# Patient Record
Sex: Male | Born: 1990 | Race: Black or African American | Hispanic: No | Marital: Single | Smoking: Former smoker
Health system: Southern US, Community
[De-identification: ages and names within clinical notes are randomized; demographics above are authoritative.]

## PROBLEM LIST (undated history)

## (undated) DIAGNOSIS — B20 Human immunodeficiency virus [HIV] disease: Secondary | ICD-10-CM

## (undated) HISTORY — DX: Human immunodeficiency virus (HIV) disease: B20

---
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
## 2011-07-01 LAB — INFLUENZA A+B VIRUS AG-DIRECT(RAPID): Influenza B Ag: NEGATIVE

## 2011-07-05 LAB — POCT URINALYSIS DIP (DEVICE)
Bilirubin Urine: NEGATIVE
Glucose, UA: NEGATIVE
Operator id: 247071
Specific Gravity, Urine: 1.02
Urobilinogen, UA: 0.2

## 2011-07-05 LAB — HIV ANTIBODY (ROUTINE TESTING W REFLEX): HIV: NONREACTIVE

## 2011-07-05 LAB — HEPATITIS B SURFACE ANTIGEN: Hepatitis B Surface Ag: NEGATIVE

## 2011-07-05 LAB — RPR: RPR Ser Ql: NONREACTIVE

## 2013-07-29 ENCOUNTER — Telehealth: Payer: Self-pay

## 2013-07-29 NOTE — Telephone Encounter (Signed)
Spoke with patient who was given new intake appointment.  He was told what documents he would need to qualify for financial assistance.   Laurell Josephs, RN

## 2013-08-08 ENCOUNTER — Ambulatory Visit (INDEPENDENT_AMBULATORY_CARE_PROVIDER_SITE_OTHER): Payer: Self-pay

## 2013-08-08 DIAGNOSIS — Z113 Encounter for screening for infections with a predominantly sexual mode of transmission: Secondary | ICD-10-CM

## 2013-08-08 DIAGNOSIS — Z79899 Other long term (current) drug therapy: Secondary | ICD-10-CM

## 2013-08-08 DIAGNOSIS — B20 Human immunodeficiency virus [HIV] disease: Secondary | ICD-10-CM

## 2013-08-08 LAB — LIPID PANEL
HDL: 68 mg/dL (ref >39)
LDL Cholesterol: 68 mg/dL (ref 0–99)

## 2013-08-08 LAB — COMPLETE METABOLIC PANEL WITH GFR
ALT: 12 U/L (ref 0–53)
AST: 18 U/L (ref 0–37)
Alkaline Phosphatase: 69 U/L (ref 39–117)
Creat: 1.05 mg/dL (ref 0.50–1.35)
Total Bilirubin: 0.5 mg/dL (ref 0.3–1.2)

## 2013-08-08 LAB — CBC WITH DIFFERENTIAL/PLATELET
Basophils Absolute: 0 10*3/uL (ref 0.0–0.1)
Eosinophils Absolute: 0 10*3/uL (ref 0.0–0.7)
Eosinophils Relative: 1 % (ref 0–5)
MCH: 27.3 pg (ref 26.0–34.0)
MCV: 82.1 fL (ref 78.0–100.0)
Platelets: 182 10*3/uL (ref 150–400)
RDW: 14.4 % (ref 11.5–15.5)
WBC: 4.6 10*3/uL (ref 4.0–10.5)

## 2013-08-09 LAB — T-HELPER CELL (CD4) - (RCID CLINIC ONLY): CD4 % Helper T Cell: 25 % — ABNORMAL LOW (ref 33–55)

## 2013-08-09 LAB — URINALYSIS
Hgb urine dipstick: NEGATIVE
Ketones, ur: NEGATIVE mg/dL
Nitrite: NEGATIVE
Urobilinogen, UA: 1 mg/dL (ref 0.0–1.0)
pH: 7.5 (ref 5.0–8.0)

## 2013-08-09 LAB — HEPATITIS A ANTIBODY, TOTAL: Hep A Total Ab: NONREACTIVE

## 2013-08-09 LAB — HEPATITIS B SURFACE ANTIGEN: Hepatitis B Surface Ag: NEGATIVE

## 2013-08-09 LAB — HIV-1 RNA ULTRAQUANT REFLEX TO GENTYP+: HIV 1 RNA Quant: 38507 copies/mL — ABNORMAL HIGH (ref <20)

## 2013-08-09 LAB — HEPATITIS B SURFACE ANTIBODY,QUALITATIVE: Hep B S Ab: POSITIVE — AB

## 2013-08-09 LAB — HEPATITIS C ANTIBODY: HCV Ab: NEGATIVE

## 2013-08-14 DIAGNOSIS — B20 Human immunodeficiency virus [HIV] disease: Secondary | ICD-10-CM | POA: Insufficient documentation

## 2013-08-14 NOTE — Progress Notes (Signed)
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  He routinely test due to his sexual orientation.   Records received.  Vaccines up to date Patient is ready to start medications.   Laurell Josephs, RN

## 2013-08-22 ENCOUNTER — Ambulatory Visit (INDEPENDENT_AMBULATORY_CARE_PROVIDER_SITE_OTHER): Payer: Self-pay | Admitting: Internal Medicine

## 2013-08-22 ENCOUNTER — Encounter: Payer: Self-pay | Admitting: Internal Medicine

## 2013-08-22 VITALS — BP 120/71 | HR 72 | Temp 97.9°F | Ht 68.0 in | Wt 139.0 lb

## 2013-08-22 DIAGNOSIS — F329 Major depressive disorder, single episode, unspecified: Secondary | ICD-10-CM

## 2013-08-22 DIAGNOSIS — Z23 Encounter for immunization: Secondary | ICD-10-CM

## 2013-08-22 DIAGNOSIS — B20 Human immunodeficiency virus [HIV] disease: Secondary | ICD-10-CM

## 2013-08-22 MED ORDER — ELVITEG-COBIC-EMTRICIT-TENOFDF 150-150-200-300 MG PO TABS: 1.0000 | ORAL_TABLET | Freq: Every day | ORAL | Status: DC

## 2013-08-22 NOTE — Progress Notes (Signed)
  Subjective:    Patient ID: Alexander Huff, male    DOB: 19-Aug-1991, 22 y.o.   MRN: [id number]  HPI He comes in as a new patient evaluation. He was recently diagnosed with health Department with HIV. His risk factors MSM. He was previously tested at his college so he did not get the result and that was within a year. No other medical problems. No weight loss, no diarrhea. He is still coping with the diagnosis but does have good support including his mother who is in the room with [place]. His questions regarding HIV been answered. He is interested in starting therapy though he is a little hesitant since he has difficulty taking pills. He understands the need for medication. His lab results were discussed with him. CD4 count is 450 viral load is 30,000. He has a rash on his torso that is a chronic rash that comes and goes.    Review of Systems  Constitutional: Negative for fever, chills, activity change, fatigue and unexpected weight change.  HENT: Negative for trouble swallowing.   Eyes: Negative for visual disturbance.  Respiratory: Negative for cough and shortness of breath.   Cardiovascular: Negative for chest pain.  Gastrointestinal: Negative for nausea, abdominal pain and diarrhea.  Musculoskeletal: Negative for back pain.  Skin: Positive for rash.  Neurological: Negative for dizziness, light-headedness and headaches.  Hematological: Negative for adenopathy.  Psychiatric/Behavioral: Positive for dysphoric mood.       Objective:   Physical Exam  Constitutional: He is oriented to person, place, and time. He appears well-developed and well-nourished. No distress.  HENT:  Mouth/Throat: No oropharyngeal exudate.  Eyes: Right eye exhibits no discharge. Left eye exhibits no discharge. No scleral icterus.  Cardiovascular: Normal rate, regular rhythm and normal heart sounds.   No murmur heard. Pulmonary/Chest: Effort normal and breath sounds normal. No respiratory distress. He has no wheezes.   Lymphadenopathy:    He has no cervical adenopathy.  Neurological: He is alert and oriented to person, place, and time.  Skin: Skin is warm and dry. No rash noted.  Psychiatric: He has a normal mood and affect. His behavior is normal.          Assessment & Plan:

## 2013-08-22 NOTE — Assessment & Plan Note (Signed)
He will be given information to followup with our counselor.

## 2013-08-22 NOTE — Assessment & Plan Note (Signed)
I had a long discussion with the patient and his mother who is in the room. He is am hesitant to start therapy because the use not good with pills but does understand the need to take it. He will come back in about 2-3 weeks and we will readdress this and he will continue to finish his drug assistance paperwork. I will start him on Stribild and this was discussed with him, and he is ready. All questions were answered. He will get hepatitis A and flu shot today. Condom use was discussed. Term effects of medication and HIV discussed

## 2013-09-12 ENCOUNTER — Telehealth: Payer: Self-pay | Admitting: *Deleted

## 2013-09-12 ENCOUNTER — Ambulatory Visit: Payer: Self-pay | Admitting: Internal Medicine

## 2013-09-12 NOTE — Telephone Encounter (Signed)
Called patient and rescheduled his appt for 09/19/13 at 4:15 pm, he no showed today. Alexander Huff

## 2013-09-19 ENCOUNTER — Encounter: Payer: Self-pay | Admitting: Internal Medicine

## 2013-09-19 ENCOUNTER — Ambulatory Visit (INDEPENDENT_AMBULATORY_CARE_PROVIDER_SITE_OTHER): Payer: Self-pay | Admitting: Internal Medicine

## 2013-09-19 VITALS — BP 128/81 | HR 69 | Temp 98.2°F | Ht 67.0 in | Wt 136.0 lb

## 2013-09-19 DIAGNOSIS — B20 Human immunodeficiency virus [HIV] disease: Secondary | ICD-10-CM

## 2013-09-19 DIAGNOSIS — F329 Major depressive disorder, single episode, unspecified: Secondary | ICD-10-CM

## 2013-09-19 NOTE — Progress Notes (Signed)
  Subjective:    Patient ID: Alexander Huff, male    DOB: 1991-03-23, 22 y.o.   MRN: [id number]  HPI  He comes in second visit. He was recently diagnosed with health Department with HIV. His risk factors MSM. He was previously tested at his college so he did not get the result and that was within a year. No other medical problems. No weight loss, no diarrhea. He feels much better with accepting his diagnosis and does not feel he has any depression. CD4 count is 450 viral load is 30,000. He unfortunately has not yet received his medication. He is still waiting for approval through the drug assistance program. He is interested in trying to maintain his weight or gained weight.   Review of Systems  Constitutional: Negative for fever, chills, activity change, fatigue and unexpected weight change.  HENT: Negative for trouble swallowing.   Eyes: Negative for visual disturbance.  Respiratory: Negative for cough and shortness of breath.   Cardiovascular: Negative for chest pain.  Gastrointestinal: Negative for nausea, abdominal pain and diarrhea.  Musculoskeletal: Negative for back pain.  Skin: Negative for rash.  Neurological: Negative for dizziness, light-headedness and headaches.  Hematological: Negative for adenopathy.  Psychiatric/Behavioral: Negative for dysphoric mood.       Objective:   Physical Exam  Constitutional: He is oriented to person, place, and time. He appears well-developed and well-nourished. No distress.  HENT:  Mouth/Throat: No oropharyngeal exudate.  Eyes: Right eye exhibits no discharge. Left eye exhibits no discharge. No scleral icterus.  Cardiovascular: Normal rate, regular rhythm and normal heart sounds.   No murmur heard. Pulmonary/Chest: Effort normal and breath sounds normal. No respiratory distress. He has no wheezes.  Lymphadenopathy:    He has no cervical adenopathy.  Neurological: He is alert and oriented to person, place, and time.  Skin: Skin is warm and  dry. No rash noted.  Psychiatric: He has a normal mood and affect. His behavior is normal.          Assessment & Plan:

## 2013-09-19 NOTE — Assessment & Plan Note (Signed)
No current concerns voiced

## 2013-09-19 NOTE — Assessment & Plan Note (Signed)
I anticipate he will get his medications in the next week or 2 and we'll schedule him for labs in 4 weeks and I will see him in 5 weeks.

## 2013-10-07 ENCOUNTER — Other Ambulatory Visit: Payer: Self-pay | Admitting: *Deleted

## 2013-10-07 DIAGNOSIS — B20 Human immunodeficiency virus [HIV] disease: Secondary | ICD-10-CM

## 2013-10-07 MED ORDER — ELVITEG-COBIC-EMTRICIT-TENOFDF 150-150-200-300 MG PO TABS: 1.0000 | ORAL_TABLET | Freq: Every day | ORAL | Status: DC

## 2013-10-17 ENCOUNTER — Other Ambulatory Visit: Payer: Self-pay

## 2013-10-24 ENCOUNTER — Encounter: Payer: Self-pay | Admitting: Internal Medicine

## 2013-10-24 ENCOUNTER — Ambulatory Visit (INDEPENDENT_AMBULATORY_CARE_PROVIDER_SITE_OTHER): Payer: Self-pay | Admitting: Internal Medicine

## 2013-10-24 VITALS — BP 116/75 | HR 77 | Temp 98.3°F | Ht 68.0 in | Wt 140.0 lb

## 2013-10-24 DIAGNOSIS — F32A Depression, unspecified: Secondary | ICD-10-CM

## 2013-10-24 DIAGNOSIS — B20 Human immunodeficiency virus [HIV] disease: Secondary | ICD-10-CM

## 2013-10-24 DIAGNOSIS — F3289 Other specified depressive episodes: Secondary | ICD-10-CM

## 2013-10-24 DIAGNOSIS — F329 Major depressive disorder, single episode, unspecified: Secondary | ICD-10-CM

## 2013-10-24 DIAGNOSIS — L42 Pityriasis rosea: Secondary | ICD-10-CM

## 2013-10-24 MED ORDER — HYDROCORTISONE 1 % EX LOTN: 1.0000 "application " | TOPICAL_LOTION | Freq: Two times a day (BID) | CUTANEOUS | Status: AC

## 2013-10-25 ENCOUNTER — Telehealth: Payer: Self-pay | Admitting: *Deleted

## 2013-10-25 DIAGNOSIS — L42 Pityriasis rosea: Secondary | ICD-10-CM | POA: Insufficient documentation

## 2013-10-25 NOTE — Assessment & Plan Note (Addendum)
Stable, no current complaints, adjusting to diagnosis

## 2013-10-25 NOTE — Assessment & Plan Note (Signed)
Doing well on regimen.  RTC in 2 weeks for labs and 3 weeks with me

## 2013-10-25 NOTE — Telephone Encounter (Signed)
Message copied by Andree CossHOWELL, Edilson Vital M on Fri Oct 25, 2013  9:10 AM ------      Message from: Gardiner BarefootOMER, ROBERT W      Created: Thu Oct 24, 2013  5:25 PM       He was interested in partner testing (for a friend not a partner who needs it).  Please remind him of when and where to bring his friend in for testing.  Thanks ------

## 2013-10-25 NOTE — Telephone Encounter (Signed)
Left a message on patient's phone asking him to call at his earliest convenience. Andree CossHowell, Alwyn Cordner M, RN

## 2013-10-25 NOTE — Progress Notes (Signed)
   Subjective:    Patient ID: Alexander Huff, male    DOB: May 12, 1991, 23 y.o.   MRN: [id number]  HPI Here for follow up of HIV.  Started Stribild 2 weeks ago. No new issues, no n/v, no new rash. Does have stable rash on torso that is mildly pruritic.  No weight loss, no diarrhea.  Does not smoke.     Review of Systems  Constitutional: Negative for fatigue.  Respiratory: Negative for shortness of breath.   Gastrointestinal: Negative for nausea, abdominal pain and diarrhea.  Neurological: Negative for dizziness and light-headedness.       Objective:   Physical Exam  Constitutional: He appears well-developed and well-nourished. No distress.  HENT:  Mouth/Throat: No oropharyngeal exudate.  Eyes: No scleral icterus.  Cardiovascular: Normal rate, regular rhythm and normal heart sounds.   No murmur heard. Pulmonary/Chest: Effort normal and breath sounds normal. No respiratory distress. He has no wheezes.  Lymphadenopathy:    He has no cervical adenopathy.  Skin: Rash noted.  Rash on torso with hypopigmented patches, somewhat a "christmas tree" pattern          Assessment & Plan:

## 2013-10-25 NOTE — Assessment & Plan Note (Signed)
Will try topical steroids.  Told patient regardless it will likely resolve in 2-3 months

## 2013-11-08 ENCOUNTER — Other Ambulatory Visit: Payer: Self-pay

## 2013-11-13 ENCOUNTER — Encounter: Payer: Self-pay | Admitting: Infectious Diseases

## 2013-11-13 ENCOUNTER — Ambulatory Visit (INDEPENDENT_AMBULATORY_CARE_PROVIDER_SITE_OTHER): Payer: Self-pay | Admitting: Infectious Diseases

## 2013-11-13 VITALS — BP 101/72 | HR 72 | Temp 98.1°F | Ht 68.0 in | Wt 141.0 lb

## 2013-11-13 DIAGNOSIS — Z113 Encounter for screening for infections with a predominantly sexual mode of transmission: Secondary | ICD-10-CM

## 2013-11-13 DIAGNOSIS — Z79899 Other long term (current) drug therapy: Secondary | ICD-10-CM

## 2013-11-13 DIAGNOSIS — B20 Human immunodeficiency virus [HIV] disease: Secondary | ICD-10-CM

## 2013-11-13 NOTE — Progress Notes (Signed)
   Subjective:    Patient ID: Alexander Huff, male    DOB: 1990/12/16, 23 y.o.   MRN: [id number]  HPI 23 yo M newly dx with HIV in 2014. Was started on stribild Dec 2014. Has had no problems taking this. Has questions about having kids.   HIV 1 RNA Quant (copies/mL)  Date Value  08/08/2013 38507*     CD4 T Cell Abs (/uL)  Date Value  08/08/2013 480      Review of Systems  Constitutional: Negative for fever, chills, appetite change and unexpected weight change.  Respiratory: Negative for shortness of breath.   Gastrointestinal: Negative for diarrhea and constipation.  Genitourinary: Negative for difficulty urinating.  Neurological: Negative for headaches.       Objective:   Physical Exam  Constitutional: He appears well-developed and well-nourished.  HENT:  Mouth/Throat: No oropharyngeal exudate.  Eyes: EOM are normal. Pupils are equal, round, and reactive to light.  Neck: Neck supple.  Cardiovascular: Normal rate, regular rhythm and normal heart sounds.   Pulmonary/Chest: Effort normal and breath sounds normal.  Abdominal: Soft. Bowel sounds are normal. He exhibits no distension. There is no tenderness.  Lymphadenopathy:    He has no cervical adenopathy.          Assessment & Plan:

## 2013-11-13 NOTE — Assessment & Plan Note (Signed)
He appears to be doing very well. I offered to repeat his blood work today but he wishes to wait until he has f/u appt. I counseled him about using condoms, rhythm method for pregnancy. I advised him that the safest method for him and his partner is for him to take his ART and be undetectable. Will see him back in 3-4 months.  It is not clear if he needs his PNVX? Otherwise his vax are uptodate, his Hep B status is immune.

## 2013-11-27 ENCOUNTER — Encounter: Payer: Self-pay | Admitting: *Deleted

## 2013-11-27 NOTE — Telephone Encounter (Signed)
Error

## 2014-01-08 ENCOUNTER — Telehealth: Payer: Self-pay | Admitting: *Deleted

## 2014-01-08 NOTE — Telephone Encounter (Signed)
Called [employer] and got Sevrin an emergency supply of Stribild.

## 2014-01-14 ENCOUNTER — Other Ambulatory Visit: Payer: Self-pay | Admitting: *Deleted

## 2014-01-14 ENCOUNTER — Encounter: Payer: Self-pay | Admitting: *Deleted

## 2014-01-14 DIAGNOSIS — B20 Human immunodeficiency virus [HIV] disease: Secondary | ICD-10-CM

## 2014-01-14 MED ORDER — ELVITEG-COBIC-EMTRICIT-TENOFDF 150-150-200-300 MG PO TABS: 1.0000 | ORAL_TABLET | Freq: Every day | ORAL | Status: DC

## 2014-01-14 NOTE — Progress Notes (Signed)
ADAP renewal 

## 2014-01-29 ENCOUNTER — Other Ambulatory Visit: Payer: Self-pay

## 2014-02-20 ENCOUNTER — Telehealth: Payer: Self-pay | Admitting: *Deleted

## 2014-02-20 ENCOUNTER — Encounter: Payer: Self-pay | Admitting: Internal Medicine

## 2014-02-20 ENCOUNTER — Ambulatory Visit (INDEPENDENT_AMBULATORY_CARE_PROVIDER_SITE_OTHER): Payer: Self-pay | Admitting: Internal Medicine

## 2014-02-20 VITALS — BP 110/72 | HR 62 | Temp 98.3°F | Ht 68.0 in | Wt 138.0 lb

## 2014-02-20 DIAGNOSIS — Z23 Encounter for immunization: Secondary | ICD-10-CM

## 2014-02-20 DIAGNOSIS — B20 Human immunodeficiency virus [HIV] disease: Secondary | ICD-10-CM

## 2014-02-20 DIAGNOSIS — K648 Other hemorrhoids: Secondary | ICD-10-CM

## 2014-02-20 DIAGNOSIS — K649 Unspecified hemorrhoids: Secondary | ICD-10-CM

## 2014-02-20 LAB — CBC WITH DIFFERENTIAL/PLATELET
BASOS PCT: 0 % (ref 0–1)
Basophils Absolute: 0 10*3/uL (ref 0.0–0.1)
EOS PCT: 1 % (ref 0–5)
Eosinophils Absolute: 0 10*3/uL (ref 0.0–0.7)
HCT: 45.7 % (ref 39.0–52.0)
HEMOGLOBIN: 15.5 g/dL (ref 13.0–17.0)
LYMPHS ABS: 2.1 10*3/uL (ref 0.7–4.0)
Lymphocytes Relative: 54 % — ABNORMAL HIGH (ref 12–46)
MCH: 28.5 pg (ref 26.0–34.0)
MCHC: 33.9 g/dL (ref 30.0–36.0)
MCV: 84.2 fL (ref 78.0–100.0)
MONOS PCT: 12 % (ref 3–12)
Monocytes Absolute: 0.5 10*3/uL (ref 0.1–1.0)
Neutro Abs: 1.3 10*3/uL — ABNORMAL LOW (ref 1.7–7.7)
Neutrophils Relative %: 33 % — ABNORMAL LOW (ref 43–77)
PLATELETS: 198 10*3/uL (ref 150–400)
RBC: 5.43 MIL/uL (ref 4.22–5.81)
RDW: 14.6 % (ref 11.5–15.5)
WBC: 3.9 10*3/uL — ABNORMAL LOW (ref 4.0–10.5)

## 2014-02-20 LAB — COMPLETE METABOLIC PANEL WITH GFR
ALT: 11 U/L (ref 0–53)
AST: 13 U/L (ref 0–37)
Albumin: 4.3 g/dL (ref 3.5–5.2)
Alkaline Phosphatase: 73 U/L (ref 39–117)
BUN: 11 mg/dL (ref 6–23)
CALCIUM: 9.6 mg/dL (ref 8.4–10.5)
CHLORIDE: 103 meq/L (ref 96–112)
CO2: 29 meq/L (ref 19–32)
CREATININE: 1.32 mg/dL (ref 0.50–1.35)
GFR, Est African American: 89 mL/min
GFR, Est Non African American: 77 mL/min
Glucose, Bld: 79 mg/dL (ref 70–99)
Potassium: 4.5 mEq/L (ref 3.5–5.3)
SODIUM: 139 meq/L (ref 135–145)
Total Bilirubin: 0.5 mg/dL (ref 0.2–1.2)
Total Protein: 7.1 g/dL (ref 6.0–8.3)

## 2014-02-20 MED ORDER — HYDROCORTISONE ACETATE 25 MG RE SUPP: 25.0000 mg | Freq: Two times a day (BID) | RECTAL | Status: AC

## 2014-02-20 NOTE — Addendum Note (Signed)
Addended by: Andree CossHOWELL, Bethani Brugger M on: 02/20/2014 02:45 PM   Modules accepted: Orders

## 2014-02-20 NOTE — Assessment & Plan Note (Signed)
Doing well on stribild.  Labs today.  RTC 3 months unless issues with todays labs.

## 2014-02-20 NOTE — Progress Notes (Signed)
   Subjective:    Patient ID: Alexander Huff, male    DOB: Jan 07, 1991, 23 y.o.   MRN: [id number]  HPI He comes in for follow up of HIV.  He was last seen about 3 month ago on Stribild which he started December 2014.  He reports excellent compliance with only 2 missed doses since then.  No nausea or issues with medicine.  No wieght loss, no diarrhea.  Has a video of what appears to be some prolapse of his anus.  It is all outer skin though, no mucous membranes exposed.  I am unsure of what this is or if it is correctable.  Some blood on toilet tissue.     Review of Systems  Constitutional: Negative for fever and fatigue.  HENT: Negative for trouble swallowing.   Gastrointestinal: Negative for nausea and diarrhea.  Skin: Negative for rash.  Neurological: Negative for dizziness and light-headedness.  Hematological: Negative for adenopathy.       Objective:   Physical Exam  Constitutional: He appears well-developed and well-nourished. No distress.  HENT:  Mouth/Throat: No oropharyngeal exudate.  Eyes: Right eye exhibits no discharge. Left eye exhibits no discharge. No scleral icterus.  Cardiovascular: Normal rate, regular rhythm and normal heart sounds.   No murmur heard. Pulmonary/Chest: Effort normal and breath sounds normal. No respiratory distress. He has no wheezes.  Lymphadenopathy:    He has no cervical adenopathy.  Skin: No rash noted.          Assessment & Plan:

## 2014-02-20 NOTE — Telephone Encounter (Signed)
Pt [facility] for [facility].  Appt is 6/1 at 9:00.  Pt is to bring $65 to the appointment.  Phone number to cancel/change appt is [phone], option #1.  Unable to notify patient - his voicemail has not been set up on his preferred number, cell is not taking incoming calls at this time.  Will call patient next week. Pt was given information at office visit to speak with [facility] about the [employer] and Agricultural engineer[employer]re's contact info for insurance. Andree CossMichelle M Kaleigh Spiegelman, RN

## 2014-02-20 NOTE — Assessment & Plan Note (Addendum)
I am not sure if this is a prolapse of some kind?  I will refer to general surgery.    Will try anusol suppository

## 2014-02-21 LAB — T-HELPER CELL (CD4) - (RCID CLINIC ONLY)
CD4 % Helper T Cell: 27 % — ABNORMAL LOW (ref 33–55)
CD4 T Cell Abs: 650 /uL (ref 400–2700)

## 2014-02-21 LAB — HIV-1 RNA QUANT-NO REFLEX-BLD: HIV 1 RNA Quant: <20 copies/mL (ref <20)

## 2014-03-13 ENCOUNTER — Encounter: Payer: Self-pay | Admitting: *Deleted

## 2014-04-09 ENCOUNTER — Other Ambulatory Visit: Payer: Self-pay | Admitting: Internal Medicine

## 2014-05-09 ENCOUNTER — Other Ambulatory Visit: Payer: Self-pay | Admitting: Internal Medicine

## 2014-05-09 DIAGNOSIS — B20 Human immunodeficiency virus [HIV] disease: Secondary | ICD-10-CM

## 2014-05-15 ENCOUNTER — Other Ambulatory Visit: Payer: Self-pay

## 2014-05-29 ENCOUNTER — Ambulatory Visit: Payer: Self-pay | Admitting: Internal Medicine

## 2014-05-29 ENCOUNTER — Other Ambulatory Visit (INDEPENDENT_AMBULATORY_CARE_PROVIDER_SITE_OTHER): Payer: Self-pay

## 2014-05-29 DIAGNOSIS — Z113 Encounter for screening for infections with a predominantly sexual mode of transmission: Secondary | ICD-10-CM

## 2014-05-29 DIAGNOSIS — Z79899 Other long term (current) drug therapy: Secondary | ICD-10-CM

## 2014-05-29 DIAGNOSIS — B20 Human immunodeficiency virus [HIV] disease: Secondary | ICD-10-CM

## 2014-05-29 LAB — LIPID PANEL
Cholesterol: 181 mg/dL (ref 0–200)
HDL: 51 mg/dL (ref >39)
LDL Cholesterol: 115 mg/dL — ABNORMAL HIGH (ref 0–99)
Total CHOL/HDL Ratio: 3.5 Ratio
Triglycerides: 77 mg/dL (ref <150)
VLDL: 15 mg/dL (ref 0–40)

## 2014-05-29 LAB — CBC WITH DIFFERENTIAL/PLATELET
BASOS ABS: 0 10*3/uL (ref 0.0–0.1)
Basophils Relative: 0 % (ref 0–1)
EOS PCT: 1 % (ref 0–5)
Eosinophils Absolute: 0 10*3/uL (ref 0.0–0.7)
HCT: 48.9 % (ref 39.0–52.0)
Hemoglobin: 16.3 g/dL (ref 13.0–17.0)
LYMPHS ABS: 2 10*3/uL (ref 0.7–4.0)
LYMPHS PCT: 60 % — AB (ref 12–46)
MCH: 28.4 pg (ref 26.0–34.0)
MCHC: 33.3 g/dL (ref 30.0–36.0)
MCV: 85.3 fL (ref 78.0–100.0)
Monocytes Absolute: 0.4 10*3/uL (ref 0.1–1.0)
Monocytes Relative: 11 % (ref 3–12)
NEUTROS PCT: 28 % — AB (ref 43–77)
Neutro Abs: 0.9 10*3/uL — ABNORMAL LOW (ref 1.7–7.7)
PLATELETS: 160 10*3/uL (ref 150–400)
RBC: 5.73 MIL/uL (ref 4.22–5.81)
RDW: 13.6 % (ref 11.5–15.5)
WBC: 3.3 10*3/uL — AB (ref 4.0–10.5)

## 2014-05-29 LAB — COMPLETE METABOLIC PANEL WITH GFR
ALT: 11 U/L (ref 0–53)
AST: 15 U/L (ref 0–37)
Albumin: 4.5 g/dL (ref 3.5–5.2)
Alkaline Phosphatase: 63 U/L (ref 39–117)
BILIRUBIN TOTAL: 0.4 mg/dL (ref 0.2–1.2)
BUN: 12 mg/dL (ref 6–23)
CHLORIDE: 102 meq/L (ref 96–112)
CO2: 27 mEq/L (ref 19–32)
CREATININE: 1.38 mg/dL — AB (ref 0.50–1.35)
Calcium: 9.9 mg/dL (ref 8.4–10.5)
GFR, EST AFRICAN AMERICAN: 84 mL/min
GFR, Est Non African American: 73 mL/min
Glucose, Bld: 80 mg/dL (ref 70–99)
Potassium: 5.1 mEq/L (ref 3.5–5.3)
SODIUM: 139 meq/L (ref 135–145)
TOTAL PROTEIN: 7.6 g/dL (ref 6.0–8.3)

## 2014-05-29 LAB — RPR

## 2014-05-30 LAB — HIV-1 RNA QUANT-NO REFLEX-BLD: HIV-1 RNA Quant, Log: <1.3 {Log} (ref <1.30)

## 2014-05-30 LAB — T-HELPER CELL (CD4) - (RCID CLINIC ONLY)
CD4 % Helper T Cell: 30 % — ABNORMAL LOW (ref 33–55)
CD4 T Cell Abs: 630 /uL (ref 400–2700)

## 2014-06-17 ENCOUNTER — Ambulatory Visit: Payer: Self-pay

## 2014-06-17 ENCOUNTER — Ambulatory Visit: Payer: Self-pay | Admitting: Internal Medicine

## 2014-07-07 ENCOUNTER — Encounter (HOSPITAL_COMMUNITY): Payer: Self-pay | Admitting: Emergency Medicine

## 2014-07-07 ENCOUNTER — Emergency Department (INDEPENDENT_AMBULATORY_CARE_PROVIDER_SITE_OTHER)
Admission: EM | Admit: 2014-07-07 | Discharge: 2014-07-07 | Disposition: A | Discharge status: Home / Self Care | Payer: Self-pay | Source: Home / Self Care | Attending: Family Medicine | Admitting: Family Medicine

## 2014-07-07 DIAGNOSIS — K600 Acute anal fissure: Secondary | ICD-10-CM

## 2014-07-07 DIAGNOSIS — K602 Anal fissure, unspecified: Secondary | ICD-10-CM

## 2014-07-07 MED ORDER — DILTIAZEM GEL 2 %
1.0000 | Freq: Three times a day (TID) | CUTANEOUS | Status: AC
Start: 2014-07-07 — End: ?

## 2014-07-07 NOTE — Discharge Instructions (Signed)
Use medicine as prescribed, see surgeon if further problems °

## 2014-07-07 NOTE — ED Provider Notes (Signed)
CSN: [id number]     Arrival date & time 07/07/14  1337 History   First MD Initiated Contact with Patient 07/07/14 1433     Chief Complaint  Patient presents with  . Rectal Pain   (Consider location/radiation/quality/duration/timing/severity/associated sxs/prior Treatment) Patient is a 23 y.o. male presenting with hematochezia. The history is provided by the patient.  Rectal Bleeding Quality:  Bright red Amount:  Scant Duration:  2 months Progression:  Unchanged Chronicity:  Chronic Context: anal fissures, anal penetration, constipation and rectal pain   Context: not hemorrhoids   Pain details:    Severity:  Mild Similar prior episodes: yes   Relieved by:  Nothing Ineffective treatments:  Hemorrhoid cream Associated symptoms: no abdominal pain and no vomiting     Past Medical History  Diagnosis Date  . HIV infection    History reviewed. No pertinent past surgical history. Family History  Problem Relation Age of Onset  . Healthy Mother   . Heart disease Father   . Blindness Father    History  Substance Use Topics  . Smoking status: Former Smoker -- 0.10 packs/day for 1 years    Types: Pipe    Start date: 08/10/2012  . Smokeless tobacco: Never Used  . Alcohol Use: 1.0 oz/week    2 drink(s) per week     Comment: occasional    Review of Systems  Constitutional: Negative.   Gastrointestinal: Positive for constipation, blood in stool, hematochezia and rectal pain. Negative for nausea, vomiting and abdominal pain.    Allergies  Review of patient's allergies indicates no known allergies.  Home Medications   Prior to Admission medications   Medication Sig Start Date End Date Taking? Authorizing Provider  diltiazem 2 % GEL Apply 1 application topically 3 (three) times daily. 07/07/14   Linna Hoff, MD  hydrocortisone (ANUSOL-HC) 25 MG suppository Place 1 suppository (25 mg total) rectally 2 (two) times daily. 02/20/14   Gardiner Barefoot, MD  hydrocortisone 1 % lotion  Apply 1 application topically 2 (two) times daily. 10/24/13   Gardiner Barefoot, MD  STRIBILD 150-150-200-300 MG TABS tablet TAKE 1 TABLET BY MOUTH EVERY DAY WITH BREAKFAST 05/09/14   Gardiner Barefoot, MD   There were no vitals taken for this visit. Physical Exam  Nursing note and vitals reviewed. Constitutional: He is oriented to person, place, and time.  Abdominal: Soft. Bowel sounds are normal.  Genitourinary:  Anal fissure tenderness, no bleeding, no hemorrhoids.  Neurological: He is alert and oriented to person, place, and time.  Skin: Skin is warm and dry.    ED Course  Procedures (including critical care time) Labs Review Labs Reviewed - No data to display  Imaging Review No results found.   MDM   1. Acute anal fissure        Linna Hoff, MD 07/07/14 [id number]

## 2014-07-07 NOTE — ED Notes (Signed)
C/o 2 month duration of rectal pain, bleeding w stool

## 2014-08-11 ENCOUNTER — Other Ambulatory Visit: Payer: Self-pay | Admitting: *Deleted

## 2014-08-11 DIAGNOSIS — B20 Human immunodeficiency virus [HIV] disease: Secondary | ICD-10-CM

## 2014-08-11 MED ORDER — ELVITEG-COBIC-EMTRICIT-TENOFDF 150-150-200-300 MG PO TABS: ORAL_TABLET | ORAL | Status: DC

## 2014-09-09 ENCOUNTER — Other Ambulatory Visit: Payer: Self-pay | Admitting: Licensed Clinical Social Worker

## 2014-09-09 DIAGNOSIS — B20 Human immunodeficiency virus [HIV] disease: Secondary | ICD-10-CM

## 2014-09-09 MED ORDER — ELVITEG-COBIC-EMTRICIT-TENOFDF 150-150-200-300 MG PO TABS: ORAL_TABLET | ORAL | Status: DC

## 2015-01-19 ENCOUNTER — Other Ambulatory Visit: Payer: Self-pay | Admitting: *Deleted

## 2015-01-19 DIAGNOSIS — B20 Human immunodeficiency virus [HIV] disease: Secondary | ICD-10-CM

## 2015-01-19 MED ORDER — ELVITEG-COBIC-EMTRICIT-TENOFDF 150-150-200-300 MG PO TABS: ORAL_TABLET | ORAL | Status: DC

## 2015-01-20 ENCOUNTER — Other Ambulatory Visit: Payer: Self-pay | Admitting: *Deleted

## 2015-01-20 DIAGNOSIS — B20 Human immunodeficiency virus [HIV] disease: Secondary | ICD-10-CM

## 2015-01-20 MED ORDER — ELVITEG-COBIC-EMTRICIT-TENOFDF 150-150-200-300 MG PO TABS: ORAL_TABLET | ORAL | Status: DC

## 2015-01-20 NOTE — Telephone Encounter (Signed)
ADAP application 

## 2015-04-15 ENCOUNTER — Other Ambulatory Visit: Payer: Self-pay | Admitting: Pharmacist Clinician (PhC)/ Clinical Pharmacy Specialist

## 2015-04-15 DIAGNOSIS — B20 Human immunodeficiency virus [HIV] disease: Secondary | ICD-10-CM

## 2015-04-15 MED ORDER — ELVITEG-COBIC-EMTRICIT-TENOFDF 150-150-200-300 MG PO TABS: 1.0000 | ORAL_TABLET | Freq: Every day | ORAL | Status: DC

## 2015-04-15 MED ORDER — ELVITEG-COBIC-EMTRICIT-TENOFDF 150-150-200-300 MG PO TABS: ORAL_TABLET | ORAL | Status: DC

## 2015-07-23 ENCOUNTER — Other Ambulatory Visit: Payer: Self-pay | Admitting: Internal Medicine

## 2015-07-23 MED ORDER — ELVITEG-COBIC-EMTRICIT-TENOFDF 150-150-200-300 MG PO TABS: 1.0000 | ORAL_TABLET | Freq: Every day | ORAL | Status: DC

## 2015-07-29 ENCOUNTER — Other Ambulatory Visit (INDEPENDENT_AMBULATORY_CARE_PROVIDER_SITE_OTHER): Payer: Self-pay

## 2015-07-29 DIAGNOSIS — Z79899 Other long term (current) drug therapy: Secondary | ICD-10-CM

## 2015-07-29 DIAGNOSIS — Z113 Encounter for screening for infections with a predominantly sexual mode of transmission: Secondary | ICD-10-CM

## 2015-07-29 DIAGNOSIS — B20 Human immunodeficiency virus [HIV] disease: Secondary | ICD-10-CM

## 2015-07-29 LAB — COMPLETE METABOLIC PANEL WITH GFR
ALT: 12 U/L (ref 9–46)
AST: 21 U/L (ref 10–40)
Albumin: 4.5 g/dL (ref 3.6–5.1)
Alkaline Phosphatase: 62 U/L (ref 40–115)
BUN: 12 mg/dL (ref 7–25)
CHLORIDE: 103 mmol/L (ref 98–110)
CO2: 29 mmol/L (ref 20–31)
Calcium: 9.5 mg/dL (ref 8.6–10.3)
Creat: 1.24 mg/dL (ref 0.60–1.35)
GFR, EST NON AFRICAN AMERICAN: 82 mL/min (ref >=60)
GLUCOSE: 81 mg/dL (ref 65–99)
POTASSIUM: 4.8 mmol/L (ref 3.5–5.3)
SODIUM: 141 mmol/L (ref 135–146)
Total Bilirubin: 0.7 mg/dL (ref 0.2–1.2)
Total Protein: 7.3 g/dL (ref 6.1–8.1)

## 2015-07-29 LAB — CBC WITH DIFFERENTIAL/PLATELET
BASOS ABS: 0 10*3/uL (ref 0.0–0.1)
BASOS PCT: 0 % (ref 0–1)
EOS ABS: 0 10*3/uL (ref 0.0–0.7)
EOS PCT: 1 % (ref 0–5)
HCT: 47.6 % (ref 39.0–52.0)
Hemoglobin: 15.7 g/dL (ref 13.0–17.0)
Lymphocytes Relative: 61 % — ABNORMAL HIGH (ref 12–46)
Lymphs Abs: 2.1 10*3/uL (ref 0.7–4.0)
MCH: 28.5 pg (ref 26.0–34.0)
MCHC: 33 g/dL (ref 30.0–36.0)
MCV: 86.5 fL (ref 78.0–100.0)
MONOS PCT: 10 % (ref 3–12)
MPV: 10.8 fL (ref 8.6–12.4)
Monocytes Absolute: 0.4 10*3/uL (ref 0.1–1.0)
NEUTROS PCT: 28 % — AB (ref 43–77)
Neutro Abs: 1 10*3/uL — ABNORMAL LOW (ref 1.7–7.7)
PLATELETS: 151 10*3/uL (ref 150–400)
RBC: 5.5 MIL/uL (ref 4.22–5.81)
RDW: 13.6 % (ref 11.5–15.5)
WBC: 3.5 10*3/uL — ABNORMAL LOW (ref 4.0–10.5)

## 2015-07-29 LAB — LIPID PANEL
CHOL/HDL RATIO: 3.7 ratio (ref <=5.0)
CHOLESTEROL: 177 mg/dL (ref 125–200)
HDL: 48 mg/dL (ref >=40)
LDL CALC: 116 mg/dL (ref <130)
TRIGLYCERIDES: 66 mg/dL (ref <150)
VLDL: 13 mg/dL (ref <30)

## 2015-07-30 LAB — HIV-1 RNA QUANT-NO REFLEX-BLD

## 2015-07-30 LAB — URINE CYTOLOGY ANCILLARY ONLY
CHLAMYDIA, DNA PROBE: NEGATIVE
Neisseria Gonorrhea: NEGATIVE

## 2015-07-30 LAB — T-HELPER CELL (CD4) - (RCID CLINIC ONLY)
CD4 T CELL ABS: 730 /uL (ref 400–2700)
CD4 T CELL HELPER: 31 % — AB (ref 33–55)

## 2015-07-30 LAB — RPR

## 2016-02-15 ENCOUNTER — Ambulatory Visit: Payer: Self-pay

## 2016-02-15 ENCOUNTER — Other Ambulatory Visit: Payer: Self-pay | Admitting: *Deleted

## 2016-02-15 ENCOUNTER — Other Ambulatory Visit: Payer: Self-pay | Admitting: Internal Medicine

## 2016-02-15 DIAGNOSIS — B20 Human immunodeficiency virus [HIV] disease: Secondary | ICD-10-CM

## 2016-02-15 MED ORDER — ELVITEG-COBIC-EMTRICIT-TENOFDF 150-150-200-300 MG PO TABS: 1.0000 | ORAL_TABLET | Freq: Every day | ORAL | Status: AC

## 2016-02-15 NOTE — Telephone Encounter (Signed)
[employer] Application. 

## 2016-02-22 ENCOUNTER — Telehealth: Payer: Self-pay | Admitting: *Deleted

## 2016-02-22 NOTE — Telephone Encounter (Signed)
Patient called for medication refills. Advised him he has not been seen here since 2015 and we will not be able to refill his medication. Asked if he would like to make an appt and he advised no will have to see what he can do and call back if he can work it out.

## 2016-02-22 NOTE — Telephone Encounter (Signed)
After speaking with this patient noticed that he has come in to the clinic to have a PA done for [employer]. Since the patient has not been in since 2015 to see the doctor thought it strange he would be able to have this done. [facility] and found that the [facility]. However today she received a call from the administrator [facility] to advise that the patient has 3 active and 1 pending accounts with them. She advised that she ran him through the computer and found he also has Nurse, learning disabilitycommercial insurance. Due the this they have closed all his open accounts and denied the pending one. Kandis Mannandvised Michelle that the patient can not get Rx from this office without labs and an office visit so if he calls we will let him know. Will let the clinic manager know to see how to proceed with this patient and for a general FYI.

## 2016-03-02 ENCOUNTER — Telehealth: Payer: Self-pay | Admitting: *Deleted

## 2016-03-02 NOTE — Telephone Encounter (Signed)
[place].  Fax number given for pt's new medical provider.  ROI to be faxed.

## 2016-03-02 NOTE — Telephone Encounter (Signed)
ROI received and records faxed
